# Patient Record
Sex: Female | Born: 1955 | Race: Black or African American | Hispanic: No | Marital: Single
Health system: Southern US, Community
[De-identification: ages and names within clinical notes are randomized; demographics above are authoritative.]

---

## 1998-03-25 ENCOUNTER — Ambulatory Visit (HOSPITAL_COMMUNITY): Admission: RE | Admit: 1998-03-25 | Discharge: 1998-03-25 | Payer: Self-pay | Admitting: Family Medicine

## 1999-04-04 ENCOUNTER — Encounter: Payer: Self-pay | Admitting: Obstetrics and Gynecology

## 1999-04-04 ENCOUNTER — Ambulatory Visit (HOSPITAL_COMMUNITY): Admission: RE | Admit: 1999-04-04 | Discharge: 1999-04-04 | Payer: Self-pay | Admitting: Obstetrics and Gynecology

## 1999-11-14 ENCOUNTER — Other Ambulatory Visit: Admission: RE | Admit: 1999-11-14 | Discharge: 1999-11-14 | Payer: Self-pay | Admitting: Obstetrics and Gynecology

## 2000-04-16 ENCOUNTER — Ambulatory Visit (HOSPITAL_COMMUNITY): Admission: RE | Admit: 2000-04-16 | Discharge: 2000-04-16 | Payer: Self-pay | Admitting: Obstetrics and Gynecology

## 2000-04-16 ENCOUNTER — Encounter: Payer: Self-pay | Admitting: Obstetrics and Gynecology

## 2000-07-02 ENCOUNTER — Inpatient Hospital Stay (HOSPITAL_COMMUNITY): Admission: RE | Admit: 2000-07-02 | Discharge: 2000-07-04 | Payer: Self-pay | Admitting: Obstetrics and Gynecology

## 2000-07-02 ENCOUNTER — Encounter (INDEPENDENT_AMBULATORY_CARE_PROVIDER_SITE_OTHER): Payer: Self-pay

## 2001-04-22 ENCOUNTER — Ambulatory Visit (HOSPITAL_COMMUNITY): Admission: RE | Admit: 2001-04-22 | Discharge: 2001-04-22 | Payer: Self-pay | Admitting: Family Medicine

## 2001-04-22 ENCOUNTER — Encounter: Payer: Self-pay | Admitting: Family Medicine

## 2002-05-12 ENCOUNTER — Encounter: Payer: Self-pay | Admitting: Family Medicine

## 2002-05-12 ENCOUNTER — Ambulatory Visit (HOSPITAL_COMMUNITY): Admission: RE | Admit: 2002-05-12 | Discharge: 2002-05-12 | Payer: Self-pay | Admitting: Family Medicine

## 2003-05-18 ENCOUNTER — Ambulatory Visit (HOSPITAL_COMMUNITY): Admission: RE | Admit: 2003-05-18 | Discharge: 2003-05-18 | Payer: Self-pay | Admitting: Family Medicine

## 2004-05-30 ENCOUNTER — Ambulatory Visit (HOSPITAL_COMMUNITY): Admission: RE | Admit: 2004-05-30 | Discharge: 2004-05-30 | Payer: Self-pay | Admitting: Family Medicine

## 2005-06-13 ENCOUNTER — Ambulatory Visit (HOSPITAL_COMMUNITY): Admission: RE | Admit: 2005-06-13 | Discharge: 2005-06-13 | Payer: Self-pay | Admitting: Endocrinology

## 2006-06-14 ENCOUNTER — Ambulatory Visit (HOSPITAL_COMMUNITY): Admission: RE | Admit: 2006-06-14 | Discharge: 2006-06-14 | Payer: Self-pay | Admitting: Family Medicine

## 2006-06-27 ENCOUNTER — Encounter: Admission: RE | Admit: 2006-06-27 | Discharge: 2006-06-27 | Payer: Self-pay | Admitting: Family Medicine

## 2007-07-14 ENCOUNTER — Ambulatory Visit (HOSPITAL_COMMUNITY): Admission: RE | Admit: 2007-07-14 | Discharge: 2007-07-14 | Payer: Self-pay | Admitting: Family Medicine

## 2008-08-09 ENCOUNTER — Ambulatory Visit (HOSPITAL_COMMUNITY): Admission: RE | Admit: 2008-08-09 | Discharge: 2008-08-09 | Payer: Self-pay | Admitting: Family Medicine

## 2009-08-10 ENCOUNTER — Ambulatory Visit (HOSPITAL_COMMUNITY): Admission: RE | Admit: 2009-08-10 | Discharge: 2009-08-10 | Payer: Self-pay | Admitting: Family Medicine

## 2010-07-16 ENCOUNTER — Encounter: Payer: Self-pay | Admitting: Family Medicine

## 2010-09-11 ENCOUNTER — Other Ambulatory Visit (HOSPITAL_COMMUNITY): Payer: Self-pay | Admitting: Family Medicine

## 2010-09-11 DIAGNOSIS — Z1231 Encounter for screening mammogram for malignant neoplasm of breast: Secondary | ICD-10-CM

## 2010-09-20 ENCOUNTER — Ambulatory Visit (HOSPITAL_COMMUNITY)
Admission: RE | Admit: 2010-09-20 | Discharge: 2010-09-20 | Disposition: A | Discharge status: Home / Self Care | Payer: Managed Care, Other (non HMO) | Source: Ambulatory Visit | Attending: Family Medicine | Admitting: Family Medicine

## 2010-09-20 DIAGNOSIS — Z1231 Encounter for screening mammogram for malignant neoplasm of breast: Secondary | ICD-10-CM

## 2010-11-10 NOTE — Op Note (Signed)
[facility]  Patient:    Carrie Hooper, Carrie Hooper                   MRN: [id number] Proc. Date: 07/02/00 Adm. Date:  [id number] Disc. Date: [id number] Attending:  Jenean Lindau CC:         Jethro Bastos, M.D.   Operative Report  PREOPERATIVE DIAGNOSES:  Leiomyoma uteri with menorrhagia and anemia.  POSTOPERATIVE DIAGNOSES:  Leiomyoma uteri with menorrhagia and anemia.  PROCEDURE:  Total abdominal hysterectomy.  SURGEON:  Laqueta Linden, M.D.  ASSISTANT:  Andres Ege, M.D.  ANESTHESIA:  General endotracheal.  ESTIMATED BLOOD LOSS:  150 cc.  URINE OUTPUT:  150 cc.  FLUIDS:  2700 cc crystalloid.  COUNTS:  Correct x 2.  COMPLICATIONS:  None.  INDICATIONS:  Carrie Hooper is a 55 year old nulligravida black female with enlarging, increasingly symptomatic fibroids; with profuse menorrhagia marginally responsive to low-dose birth control pills, with anemia with a hemoglobin as low as 7.0.  She has most recently been on continuous pills to stop her menses and on iron twice daily; has increased her hemoglobin to 11.0. She is having increasing pelvic pressure, low back pain, leg pain, as well as urinary frequency and decreased capacity, continuing to have lower pelvic pressure and cramping.  Options, including myomectomy versus hysterectomy were discussed.  The patient elected to proceed to definitive surgical management.  She understands that this will render her permanently and irrevocably incapable of bearing children.  She has been acceptably counseled after the risks, benefits, options and complications and agrees to proceed.  She has signed the permanent sterilization consent.  She understands that ovaries will be removed only if medically indicated.  She has received Ancef for preoperative prophylaxis.  DESCRIPTION OF PROCEDURE:  The patient was taken to the operating room, and after proper identification and consent was  ascertained, she was placed on the operating table in supine position.  After the induction of general endotracheal anesthesia, she was placed in the frog-leg position and the abdomen, perineum and vagina were prepped and draped in a routine sterile fashion.  A transurethral Foley was placed.  A low transverse incision was made above the level of the patients funiculus, and carried down to the level of the anterior fascia.  The fascia was incised and the incision extended laterally, superiorly and inferiorly.  The rectus muscles were separated and the parietal peritoneum elevated and incised.  The incision was extended superiorly and inferiorly to the level of the bladder. Palpation of the upper abdomen revealed smooth renal contours bilaterally, with smooth liver edge and no obvious gallstones.  The appendix was visualized and palpated, with no abnormalities or stones.  The self-retaining retractor was placed and the bowel packed in the upper abdomen using moistened lap packs.  Exposure was difficult due to the patients very small pelvis and the large size of her fibroids.  The uterus was grossly distorted by multiple serosal and intramural fibroids.  There were no adhesions noted.  Both tubes and ovaries appeared normal.  The uterus was elevated as best as possible with curved Kelly clamps.  The round ligaments were clamped, cut and suture ligated bilaterally.  Dissection was carried forward in the anterior leaf of the broad ligament, with sharp and blunt dissection of the bladder off of the lower uterine segment and cervix.  A window was made in the posterior broad ligament and curved Heaney clamps placed across the proximal adnexal pedicles bilaterally; with the  pedicles cut and doubly ligated with a free tie and a stitch of 0 Vicryl, with retention of both tubes and ovaries.  The adnexa were suspended to the ipsilateral round ligament at the conclusion of the procedure.  At this  point, the uterus was further elevated and the uterine vessels were clamped perpendicularly at the level of the internal os.  Pedicles were cut and suture ligated using 0 Vicryl suture.  An additional pedicle on the cardinal ligament was taken, with this suture ligature incorporated in the prior one, such that the uterine vessels were doubly ligated.  At this point, the uterus was transected from the cervix to allow better visualization.  Successive straight Heaney clamps were then placed across the cardinal ligaments bilaterally, with pedicles cut and suture ligated.  Curved Heaney clamps were placed across the upper vaginal ligaments bilaterally; with pedicles cut and Heaney ligated.  The cervix was then circumscribed with the scalpel, with removal of the remainder of the specimen; which was sent for final sectioning to pathology.  Richardson angled sutures were then placed in both vaginal angles, with excellent hemostasis noted.  The remainder of the vaginal cuff was closed from front to back using interrupted figure-of-eight sutures of 0 Vicryl.  Several small bleeding points were cauterized.  Copious lavage was accomplished. Hemostasis was excellent.  All pedicles were dry.  The retractor was then released.  Upon release of the retractor there was noted to be a slight amount of blood-tinged urine, felt due to the pressure of the retractor.  There was no increasing or large amounts of hematuria identified.  Both ureters appeared normal to visualization deep in the pelvis. All lap packs were removed.  Needle, sponge and instrument counts were correct x 2 prior to concluding the procedure.  The parietal peritoneum was closed in running fashion using 2-0 Vicryl suture.  The rectus muscles were then loosely reapproximated in the midline.  Subfascial hemostasis was ascertained and the fascia was closed from both lateral aspects to the midline with a running stitch of 0 Maxon, which was  then tied in the middle.  Subcutaneous hemostasis  was ascertained.  Staples and Steri-Strips were placed.  The incision was then covered with a pressure dressing.  The patient was extubated and stable on transfer to the recovery room.  The estimated blood loss was 150 cc.  Urine output 150 cc, fluids 2700 cc crystalloid.  Counts correct x 2. Complications:  None. DD:  07/02/00 TD:  07/02/00 Job: [id number]

## 2010-11-10 NOTE — H&P (Signed)
[facility]  Patient:    Carrie Hooper, Carrie Hooper                   MRN: [id number] Adm. Date:  [id number] Attending:  Jenean Lindau                         History and Physical  IDENTIFYING DATA:  The patient is a 55 year old female with enlarging, increasingly symptomatic fibroids, admitted for TAH.  HISTORY OF PRESENT ILLNESS:  The patient is a 55 year old nulligravida single black female who has been followed by this office for the past two years.  She initially presented in May of 2000 complaining of worsening dysmenorrhea, menorrhagia and mittelschmerz.  She reportedly had had a normal hemoglobin in January of 2000.  At that time, she was placed on low-dose birth control pills in an effort to improve her symptoms.  She had good symptomatic relief with decrease in flow and cramping.  At that time, her uterus was felt to be essentially normal in size, although exam was compromised by her short stature and obesity.  She presented in August of 2000 with a hemoglobin of 9.5.  She noted some increase in withdrawal bleeding and had a switch in her pills.  She continued to be monitored with a hemoglobin as low at 7.4 in October of 2001. She was examined by a different clinician in May of 2001 and again was felt to have a normal-size uterus; however, when she was seen again in October of 2001, it was felt that she had a significantly enlarged uterus.  She was also noted to have a hemoglobin of 7.4 at that time with again worsening in her flow.  She underwent pelvic ultrasound which revealed multiple fibroids, one of which appeared to be submucosal, but only 50% or less.  Discussion was held with the patient regarding various management alternatives including a trial of Lupron to shrink the fibroids with attempted resection of the submucosal fibroid versus embolization versus myomectomy versus hysterectomy.  The issues of childbearing and permanent sterilization  were discussed at length.  After an extensive amount of counseling and thought, the patient elected to proceed to hysterectomy.  She understands that this will render her permanently sterilized.  She has been on continuous birth control pills for the past three months prior to surgery and aggressive iron replacement therapy in an effort to improve her hemoglobin preoperatively.  It was felt that the benefits of avoiding transfusion outweighed the increased risk of a postoperative clot in terms of her being on the birth control pill and plans are to aggressively ambulate her and use compression stockings to decrease her clot risk.  The patient presents now for definitive surgery.  Her most recent hemoglobin one month prior to surgery had increased to 10.8.  PAST HISTORY Medical:   Anemia, as noted above.  Surgical:  Negative.  ALLERGIES:  No known drug allergies.  No latex sensitivity.  CURRENT MEDICATIONS 1. Iron twice daily. 2. Loestrin 1/20 continuous-active pills.  TRANSFUSION HISTORY:  Negative.  SOCIAL HISTORY:  Patient is single and works for a company in collections. She has had no current partner for greater than one year.  She denies smoking, alcohol or illicit drug use.  FAMILY HISTORY:  Positive for adult-onset diabetes in both parents and maternal grandmother and hypertension in her father; otherwise, noncontributory.  REVIEW OF SYSTEMS:  Review of systems is notable for the history of present illness;  otherwise, noncontributory.  PHYSICAL EXAMINATION  GENERAL:  Physical exam prior to admission reveals an obese black female in no apparent distress.  Height -- 4 feet 10 inches.  Weight -- 179 pounds.  VITAL SIGNS:  Blood pressure 116/70.  HEENT:  Grossly negative.  Oropharynx clear.  NECK:  Supple without thyromegaly or lymphadenopathy.  CHEST:  Without spine or CVA tenderness.  LUNGS:  Clear to auscultation.  HEART:  Regular rate and rhythm with a grade  2/6 systolic murmur at the base radiating to the carotids, with no thrill or bruit and no click appreciated.  BREASTS:  Without masses.  ABDOMEN:  Obese, without hepatosplenomegaly.  Slight amount of panniculus. Uterus 14- to 16-week-size and grossly irregular.  EXTREMITIES:  Without edema.  NEUROLOGIC:  Grossly nonfocal.  PELVIC:  Normal female external genitalia.  Clean vagina and cervix.   Uterus enlarged, as noted.  Adnexa not palpated.  Rectovaginal confirmatory.  LABORATORY AND X-RAY FINDINGS:  Negative urine pregnancy test.  Urinalysis contaminated with blood with some protein.  Comprehensive metabolic panel within normal limits.  Coagulation studies normal.  Hemoglobin 11.3, hematocrit of 34.5, white count of 5.1, normal platelets.  Chest x-ray:  Deferred by anesthesia.  EKG:  Normal sinus rhythm with left atrial enlargement, unconfirmed.  IMPRESSION ON ADMISSION:  Enlarging, increasingly symptomatic fibroid uterus in a 55 year old nulligravida female with a desire for definitive surgical management.  PLAN:  The patient is admitted for total abdominal hysterectomy.  She has been extensively counseled as to risks, benefits, alternatives and complications and agrees to proceed.  Full consent has been given, including permanent sterilization. DD:  07/02/00 TD:  07/02/00 Job: [id number]

## 2011-09-11 ENCOUNTER — Other Ambulatory Visit (HOSPITAL_COMMUNITY): Payer: Self-pay | Admitting: Family Medicine

## 2011-09-11 DIAGNOSIS — Z1231 Encounter for screening mammogram for malignant neoplasm of breast: Secondary | ICD-10-CM

## 2011-10-05 ENCOUNTER — Ambulatory Visit (HOSPITAL_COMMUNITY): Payer: Managed Care, Other (non HMO)

## 2011-10-29 ENCOUNTER — Ambulatory Visit (HOSPITAL_COMMUNITY)
Admission: RE | Admit: 2011-10-29 | Discharge: 2011-10-29 | Disposition: A | Discharge status: Home / Self Care | Payer: BC Managed Care – PPO | Source: Ambulatory Visit | Attending: Family Medicine | Admitting: Family Medicine

## 2011-10-29 DIAGNOSIS — Z1231 Encounter for screening mammogram for malignant neoplasm of breast: Secondary | ICD-10-CM | POA: Insufficient documentation

## 2012-11-05 ENCOUNTER — Other Ambulatory Visit (HOSPITAL_COMMUNITY): Payer: Self-pay | Admitting: Family Medicine

## 2012-11-05 DIAGNOSIS — Z1231 Encounter for screening mammogram for malignant neoplasm of breast: Secondary | ICD-10-CM

## 2012-11-11 ENCOUNTER — Ambulatory Visit
Admission: RE | Admit: 2012-11-11 | Discharge: 2012-11-11 | Disposition: A | Discharge status: Home / Self Care | Payer: BC Managed Care – PPO | Source: Ambulatory Visit | Attending: Family Medicine | Admitting: Family Medicine

## 2012-11-11 ENCOUNTER — Ambulatory Visit (HOSPITAL_COMMUNITY)
Admission: RE | Admit: 2012-11-11 | Discharge: 2012-11-11 | Disposition: A | Discharge status: Home / Self Care | Payer: BC Managed Care – PPO | Source: Ambulatory Visit | Attending: Family Medicine | Admitting: Family Medicine

## 2012-11-11 ENCOUNTER — Other Ambulatory Visit: Payer: Self-pay | Admitting: Family Medicine

## 2012-11-11 DIAGNOSIS — Z1231 Encounter for screening mammogram for malignant neoplasm of breast: Secondary | ICD-10-CM | POA: Insufficient documentation

## 2012-11-11 DIAGNOSIS — M25572 Pain in left ankle and joints of left foot: Secondary | ICD-10-CM

## 2013-11-09 ENCOUNTER — Other Ambulatory Visit (HOSPITAL_COMMUNITY): Payer: Self-pay | Admitting: Family Medicine

## 2013-11-09 DIAGNOSIS — Z1231 Encounter for screening mammogram for malignant neoplasm of breast: Secondary | ICD-10-CM

## 2013-11-18 ENCOUNTER — Ambulatory Visit (HOSPITAL_COMMUNITY)
Admission: RE | Admit: 2013-11-18 | Discharge: 2013-11-18 | Disposition: A | Discharge status: Home / Self Care | Payer: BC Managed Care – PPO | Source: Ambulatory Visit | Attending: Family Medicine | Admitting: Family Medicine

## 2013-11-18 DIAGNOSIS — Z1231 Encounter for screening mammogram for malignant neoplasm of breast: Secondary | ICD-10-CM

## 2014-10-28 ENCOUNTER — Other Ambulatory Visit (HOSPITAL_COMMUNITY): Payer: Self-pay | Admitting: Family Medicine

## 2014-10-28 DIAGNOSIS — Z1231 Encounter for screening mammogram for malignant neoplasm of breast: Secondary | ICD-10-CM

## 2014-11-24 ENCOUNTER — Ambulatory Visit (HOSPITAL_COMMUNITY)
Admission: RE | Admit: 2014-11-24 | Discharge: 2014-11-24 | Disposition: A | Discharge status: Home / Self Care | Payer: BLUE CROSS/BLUE SHIELD | Source: Ambulatory Visit | Attending: Family Medicine | Admitting: Family Medicine

## 2014-11-24 DIAGNOSIS — Z1231 Encounter for screening mammogram for malignant neoplasm of breast: Secondary | ICD-10-CM | POA: Insufficient documentation

## 2015-11-25 ENCOUNTER — Other Ambulatory Visit: Payer: Self-pay | Admitting: Family Medicine

## 2015-11-25 DIAGNOSIS — Z1231 Encounter for screening mammogram for malignant neoplasm of breast: Secondary | ICD-10-CM

## 2015-12-05 ENCOUNTER — Ambulatory Visit
Admission: RE | Admit: 2015-12-05 | Discharge: 2015-12-05 | Disposition: A | Discharge status: Home / Self Care | Payer: BLUE CROSS/BLUE SHIELD | Source: Ambulatory Visit | Attending: Family Medicine | Admitting: Family Medicine

## 2015-12-05 DIAGNOSIS — Z1231 Encounter for screening mammogram for malignant neoplasm of breast: Secondary | ICD-10-CM

## 2016-11-27 ENCOUNTER — Other Ambulatory Visit: Payer: Self-pay | Admitting: Family Medicine

## 2016-11-27 DIAGNOSIS — Z1231 Encounter for screening mammogram for malignant neoplasm of breast: Secondary | ICD-10-CM

## 2016-12-12 ENCOUNTER — Ambulatory Visit
Admission: RE | Admit: 2016-12-12 | Discharge: 2016-12-12 | Disposition: A | Discharge status: Home / Self Care | Payer: BLUE CROSS/BLUE SHIELD | Source: Ambulatory Visit | Attending: Family Medicine | Admitting: Family Medicine

## 2016-12-12 DIAGNOSIS — Z1231 Encounter for screening mammogram for malignant neoplasm of breast: Secondary | ICD-10-CM

## 2017-12-02 ENCOUNTER — Other Ambulatory Visit: Payer: Self-pay | Admitting: Family Medicine

## 2017-12-02 DIAGNOSIS — Z1231 Encounter for screening mammogram for malignant neoplasm of breast: Secondary | ICD-10-CM

## 2017-12-23 ENCOUNTER — Ambulatory Visit
Admission: RE | Admit: 2017-12-23 | Discharge: 2017-12-23 | Disposition: A | Discharge status: Home / Self Care | Payer: BLUE CROSS/BLUE SHIELD | Source: Ambulatory Visit | Attending: Family Medicine | Admitting: Family Medicine

## 2017-12-23 DIAGNOSIS — Z1231 Encounter for screening mammogram for malignant neoplasm of breast: Secondary | ICD-10-CM

## 2019-01-02 ENCOUNTER — Other Ambulatory Visit: Payer: Self-pay | Admitting: Family Medicine

## 2019-01-02 DIAGNOSIS — Z1231 Encounter for screening mammogram for malignant neoplasm of breast: Secondary | ICD-10-CM

## 2019-02-17 ENCOUNTER — Ambulatory Visit
Admission: RE | Admit: 2019-02-17 | Discharge: 2019-02-17 | Disposition: A | Discharge status: Home / Self Care | Payer: BC Managed Care – PPO | Source: Ambulatory Visit | Attending: Family Medicine | Admitting: Family Medicine

## 2019-02-17 ENCOUNTER — Other Ambulatory Visit: Payer: Self-pay

## 2019-02-17 DIAGNOSIS — Z1231 Encounter for screening mammogram for malignant neoplasm of breast: Secondary | ICD-10-CM

## 2019-08-29 ENCOUNTER — Ambulatory Visit: Payer: BC Managed Care – PPO | Attending: Internal Medicine

## 2019-08-29 DIAGNOSIS — Z23 Encounter for immunization: Secondary | ICD-10-CM | POA: Insufficient documentation

## 2019-08-29 NOTE — Progress Notes (Signed)
   Covid-19 Vaccination Clinic  Name:  Carrie Hooper    MRN: [id number] DOB: 01-22-1956  08/29/2019  [name] was observed post Covid-19 immunization for 15 minutes without incident. She was provided with Vaccine Information Sheet and instruction to access the V-Safe system.   [name] was instructed to call 911 with any severe reactions post vaccine: Marland Kitchen Difficulty breathing  . Swelling of face and throat  . A fast heartbeat  . A bad rash all over body  . Dizziness and weakness   Immunizations Administered    Name Date Dose VIS Date Route   Pfizer COVID-19 Vaccine 08/29/2019  9:11 AM 0.3 mL 06/05/2019 Intramuscular   Manufacturer: [employer], [employer]   Lot: [id number]   NDC: [address]-6

## 2019-09-19 ENCOUNTER — Ambulatory Visit: Payer: BC Managed Care – PPO | Attending: Internal Medicine

## 2019-09-19 DIAGNOSIS — Z23 Encounter for immunization: Secondary | ICD-10-CM

## 2019-09-19 NOTE — Progress Notes (Signed)
   Covid-19 Vaccination Clinic  Name:  Carrie Hooper    MRN: [id number] DOB: 1956/03/16  09/19/2019  Carrie Hooper was observed post Covid-19 immunization for 15 minutes without incident. She was provided with Vaccine Information Sheet and instruction to access the V-Safe system.   Carrie Hooper was instructed to call 911 with any severe reactions post vaccine: Marland Kitchen Difficulty breathing  . Swelling of face and throat  . A fast heartbeat  . A bad rash all over body  . Dizziness and weakness   Immunizations Administered    Name Date Dose VIS Date Route   Pfizer COVID-19 Vaccine 09/19/2019  9:02 AM 0.3 mL 06/05/2019 Intramuscular   Manufacturer: [employer], [employer]   Lot: [id number]   NDC: [address]-1

## 2020-02-03 ENCOUNTER — Other Ambulatory Visit: Payer: Self-pay | Admitting: Family Medicine

## 2020-02-03 DIAGNOSIS — Z1231 Encounter for screening mammogram for malignant neoplasm of breast: Secondary | ICD-10-CM

## 2020-02-18 ENCOUNTER — Other Ambulatory Visit: Payer: Self-pay

## 2020-02-18 ENCOUNTER — Ambulatory Visit
Admission: RE | Admit: 2020-02-18 | Discharge: 2020-02-18 | Disposition: A | Discharge status: Home / Self Care | Payer: BC Managed Care – PPO | Source: Ambulatory Visit | Attending: Family Medicine | Admitting: Family Medicine

## 2020-02-18 DIAGNOSIS — Z1231 Encounter for screening mammogram for malignant neoplasm of breast: Secondary | ICD-10-CM

## 2020-12-06 IMAGING — MG DIGITAL SCREENING BILAT W/ CAD
5 series · 5 of 5 positions shown · non-contrast
Comparison: Previous exam(s).

CLINICAL DATA: Screening.

EXAM:
DIGITAL SCREENING BILATERAL MAMMOGRAM WITH CAD

[L CC]
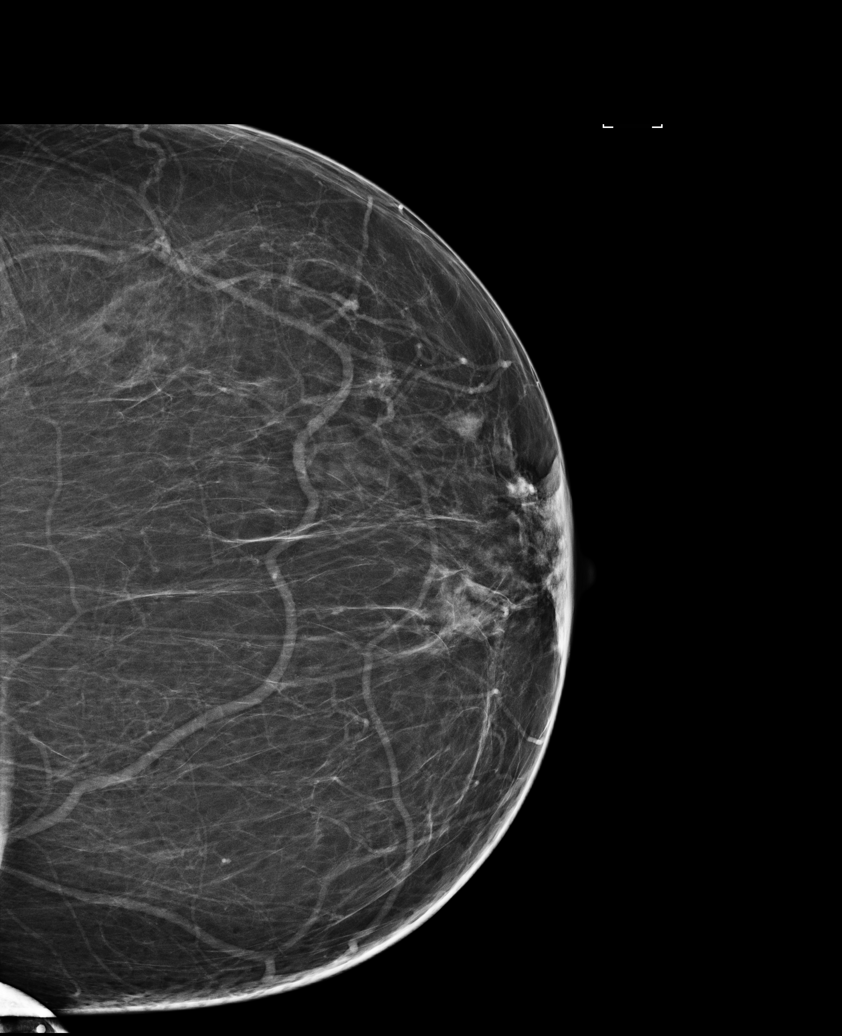

[L MLO]
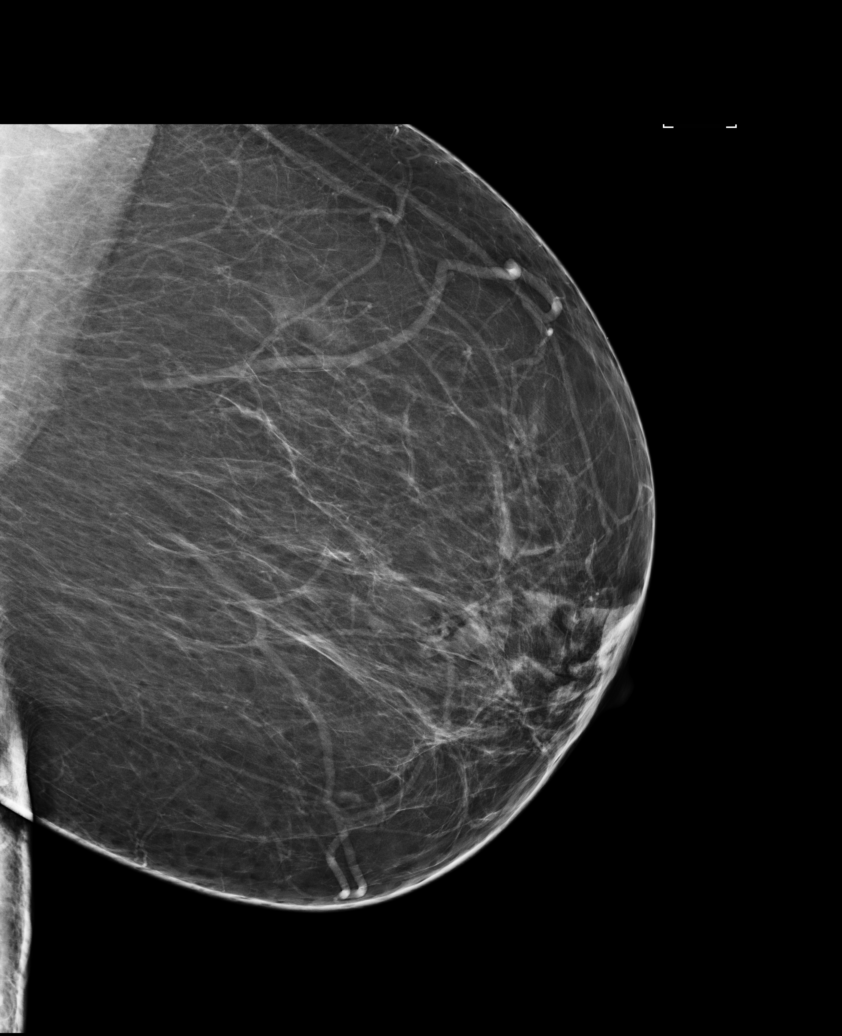

[R CC]
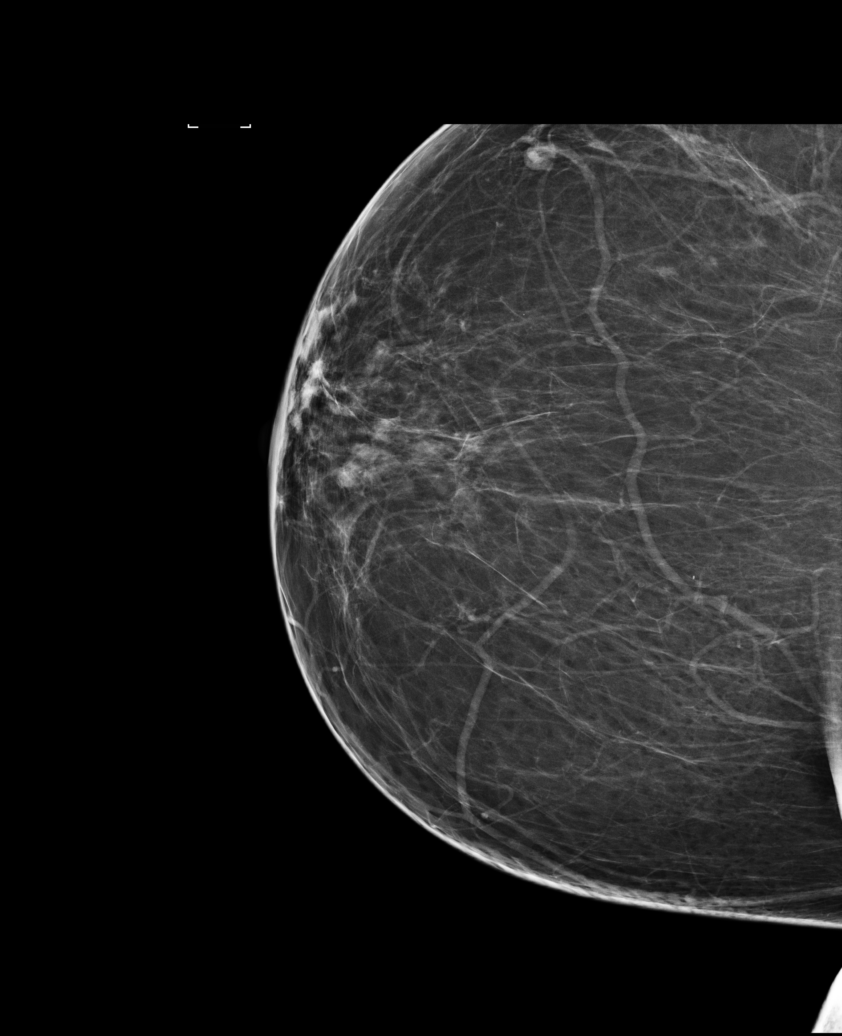

[L CV]
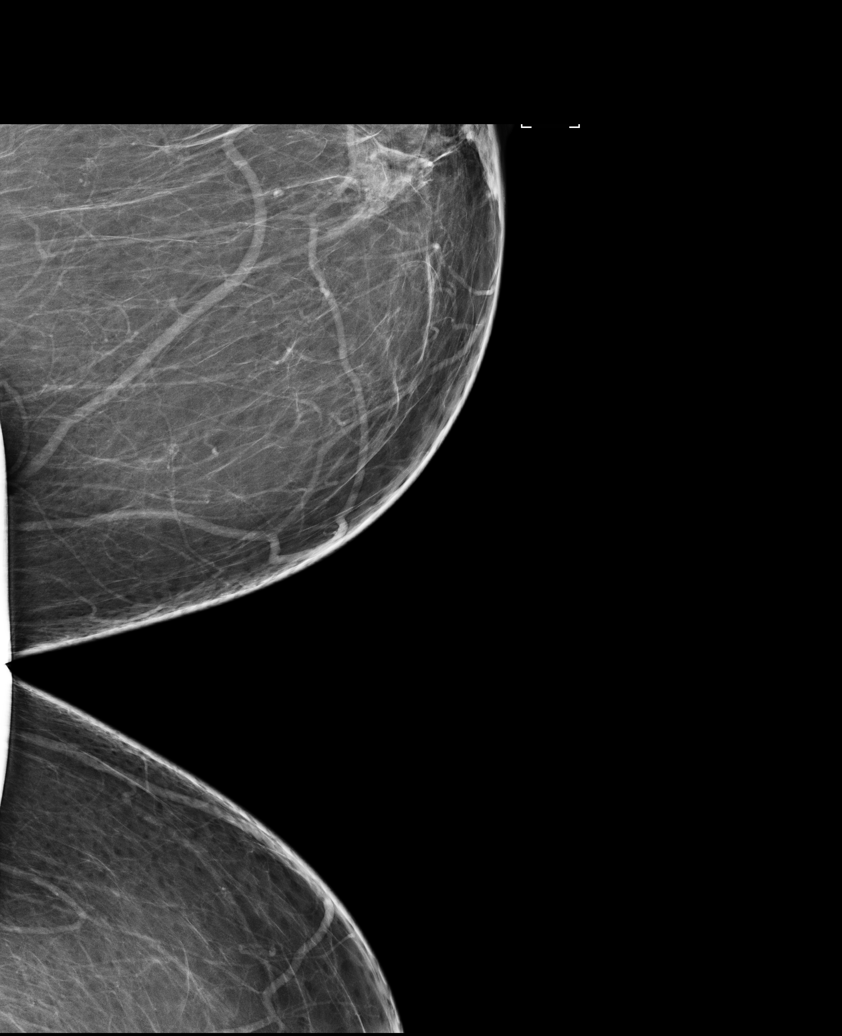

[R MLO]
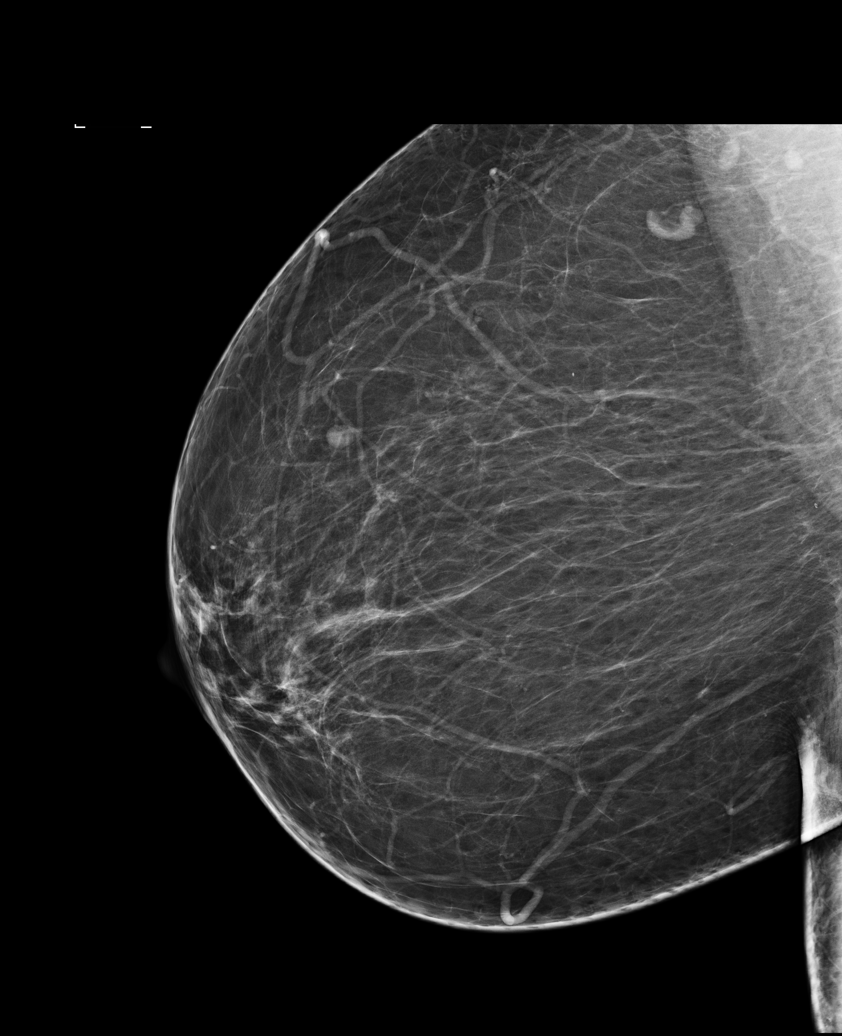

[5 of 5 positions shown; findings below may reference images not displayed]

ACR Breast Density Category b: There are scattered areas of
fibroglandular density.
FINDINGS: There are no findings suspicious for malignancy. Images were
processed with CAD.
IMPRESSION: No mammographic evidence of malignancy. A result letter of this
screening mammogram will be mailed directly to the patient.

RECOMMENDATION:
Screening mammogram in one year. (Code:AS-G-LCT)

BI-RADS CATEGORY  1: Negative.

## 2021-01-27 ENCOUNTER — Other Ambulatory Visit: Payer: Self-pay | Admitting: Family Medicine

## 2021-01-27 DIAGNOSIS — Z1231 Encounter for screening mammogram for malignant neoplasm of breast: Secondary | ICD-10-CM

## 2021-03-20 ENCOUNTER — Ambulatory Visit
Admission: RE | Admit: 2021-03-20 | Discharge: 2021-03-20 | Disposition: A | Discharge status: Home / Self Care | Payer: BC Managed Care – PPO | Source: Ambulatory Visit | Attending: Family Medicine | Admitting: Family Medicine

## 2021-03-20 ENCOUNTER — Other Ambulatory Visit: Payer: Self-pay

## 2021-03-20 DIAGNOSIS — Z1231 Encounter for screening mammogram for malignant neoplasm of breast: Secondary | ICD-10-CM

## 2021-06-29 ENCOUNTER — Telehealth: Payer: Self-pay

## 2021-06-29 NOTE — Progress Notes (Signed)
Open in error

## 2022-03-02 ENCOUNTER — Other Ambulatory Visit: Payer: Self-pay | Admitting: Family Medicine

## 2022-03-02 DIAGNOSIS — Z1231 Encounter for screening mammogram for malignant neoplasm of breast: Secondary | ICD-10-CM

## 2022-03-21 ENCOUNTER — Ambulatory Visit: Payer: BC Managed Care – PPO

## 2022-04-12 ENCOUNTER — Ambulatory Visit
Admission: RE | Admit: 2022-04-12 | Discharge: 2022-04-12 | Disposition: A | Discharge status: Home / Self Care | Payer: BC Managed Care – PPO | Source: Ambulatory Visit | Attending: Family Medicine | Admitting: Family Medicine

## 2022-04-12 DIAGNOSIS — Z1231 Encounter for screening mammogram for malignant neoplasm of breast: Secondary | ICD-10-CM

## 2023-03-20 ENCOUNTER — Other Ambulatory Visit: Payer: Self-pay | Admitting: Family Medicine

## 2023-03-20 DIAGNOSIS — Z1231 Encounter for screening mammogram for malignant neoplasm of breast: Secondary | ICD-10-CM

## 2023-04-17 ENCOUNTER — Ambulatory Visit
Admission: RE | Admit: 2023-04-17 | Discharge: 2023-04-17 | Disposition: A | Discharge status: Home / Self Care | Payer: BC Managed Care – PPO | Source: Ambulatory Visit | Attending: Family Medicine | Admitting: Family Medicine

## 2023-04-17 DIAGNOSIS — Z1231 Encounter for screening mammogram for malignant neoplasm of breast: Secondary | ICD-10-CM

## 2024-01-30 DIAGNOSIS — R7303 Prediabetes: Secondary | ICD-10-CM | POA: Diagnosis not present

## 2024-04-09 ENCOUNTER — Other Ambulatory Visit: Payer: Self-pay | Admitting: Family Medicine

## 2024-04-09 DIAGNOSIS — Z1231 Encounter for screening mammogram for malignant neoplasm of breast: Secondary | ICD-10-CM

## 2024-04-24 ENCOUNTER — Ambulatory Visit
Admission: RE | Admit: 2024-04-24 | Discharge: 2024-04-24 | Disposition: A | Discharge status: Home / Self Care | Payer: Self-pay | Source: Ambulatory Visit | Attending: Family Medicine | Admitting: Family Medicine

## 2024-04-24 DIAGNOSIS — Z1231 Encounter for screening mammogram for malignant neoplasm of breast: Secondary | ICD-10-CM
